# Patient Record
Sex: Female | Born: 1940 | Race: White | Hispanic: No | Marital: Single | Smoking: Never smoker
Health system: Southern US, Community
[De-identification: ages and names within clinical notes are randomized; demographics above are authoritative.]

## PROBLEM LIST (undated history)

## (undated) DIAGNOSIS — G250 Essential tremor: Secondary | ICD-10-CM

## (undated) DIAGNOSIS — K219 Gastro-esophageal reflux disease without esophagitis: Secondary | ICD-10-CM

## (undated) HISTORY — DX: Essential tremor: G25.0

## (undated) HISTORY — PX: APPENDECTOMY: SHX54

## (undated) HISTORY — DX: Gastro-esophageal reflux disease without esophagitis: K21.9

---

## 1978-07-17 HISTORY — PX: TONSILECTOMY/ADENOIDECTOMY WITH MYRINGOTOMY: SHX6125

## 1999-05-18 ENCOUNTER — Encounter: Admission: RE | Admit: 1999-05-18 | Discharge: 1999-05-18 | Payer: Self-pay | Admitting: Emergency Medicine

## 1999-05-18 ENCOUNTER — Encounter: Payer: Self-pay | Admitting: Emergency Medicine

## 1999-11-25 ENCOUNTER — Encounter: Payer: Self-pay | Admitting: Emergency Medicine

## 1999-11-25 ENCOUNTER — Encounter: Admission: RE | Admit: 1999-11-25 | Discharge: 1999-11-25 | Payer: Self-pay | Admitting: Emergency Medicine

## 2000-04-30 ENCOUNTER — Encounter: Payer: Self-pay | Admitting: Emergency Medicine

## 2000-04-30 ENCOUNTER — Encounter: Admission: RE | Admit: 2000-04-30 | Discharge: 2000-04-30 | Payer: Self-pay | Admitting: Emergency Medicine

## 2000-11-26 ENCOUNTER — Encounter: Admission: RE | Admit: 2000-11-26 | Discharge: 2000-11-26 | Payer: Self-pay | Admitting: Emergency Medicine

## 2000-11-26 ENCOUNTER — Encounter: Payer: Self-pay | Admitting: Emergency Medicine

## 2001-01-16 ENCOUNTER — Ambulatory Visit (HOSPITAL_COMMUNITY): Admission: RE | Admit: 2001-01-16 | Discharge: 2001-01-16 | Payer: Self-pay | Admitting: Emergency Medicine

## 2001-03-27 ENCOUNTER — Encounter: Admission: RE | Admit: 2001-03-27 | Discharge: 2001-03-27 | Payer: Self-pay | Admitting: Emergency Medicine

## 2001-03-27 ENCOUNTER — Encounter: Payer: Self-pay | Admitting: Emergency Medicine

## 2001-04-29 ENCOUNTER — Other Ambulatory Visit: Admission: RE | Admit: 2001-04-29 | Discharge: 2001-04-29 | Payer: Self-pay | Admitting: Obstetrics and Gynecology

## 2001-11-27 ENCOUNTER — Encounter: Payer: Self-pay | Admitting: Emergency Medicine

## 2001-11-27 ENCOUNTER — Encounter: Admission: RE | Admit: 2001-11-27 | Discharge: 2001-11-27 | Payer: Self-pay | Admitting: Emergency Medicine

## 2002-04-29 ENCOUNTER — Other Ambulatory Visit: Admission: RE | Admit: 2002-04-29 | Discharge: 2002-04-29 | Payer: Self-pay | Admitting: Gynecology

## 2002-07-25 ENCOUNTER — Encounter: Payer: Self-pay | Admitting: Emergency Medicine

## 2002-07-25 ENCOUNTER — Encounter: Admission: RE | Admit: 2002-07-25 | Discharge: 2002-07-25 | Payer: Self-pay | Admitting: Emergency Medicine

## 2002-08-25 ENCOUNTER — Encounter: Admission: RE | Admit: 2002-08-25 | Discharge: 2002-08-25 | Payer: Self-pay | Admitting: Gynecology

## 2002-08-25 ENCOUNTER — Encounter: Payer: Self-pay | Admitting: Gynecology

## 2002-12-01 ENCOUNTER — Encounter: Payer: Self-pay | Admitting: Emergency Medicine

## 2002-12-01 ENCOUNTER — Encounter: Admission: RE | Admit: 2002-12-01 | Discharge: 2002-12-01 | Payer: Self-pay | Admitting: Emergency Medicine

## 2003-02-12 ENCOUNTER — Encounter: Payer: Self-pay | Admitting: Internal Medicine

## 2003-04-30 ENCOUNTER — Other Ambulatory Visit: Admission: RE | Admit: 2003-04-30 | Discharge: 2003-04-30 | Payer: Self-pay | Admitting: Gynecology

## 2003-05-15 ENCOUNTER — Encounter: Admission: RE | Admit: 2003-05-15 | Discharge: 2003-05-15 | Payer: Self-pay | Admitting: Emergency Medicine

## 2003-12-02 ENCOUNTER — Encounter: Admission: RE | Admit: 2003-12-02 | Discharge: 2003-12-02 | Payer: Self-pay | Admitting: Emergency Medicine

## 2004-05-03 ENCOUNTER — Other Ambulatory Visit: Admission: RE | Admit: 2004-05-03 | Discharge: 2004-05-03 | Payer: Self-pay | Admitting: Gynecology

## 2004-12-05 ENCOUNTER — Encounter: Admission: RE | Admit: 2004-12-05 | Discharge: 2004-12-05 | Payer: Self-pay | Admitting: Emergency Medicine

## 2005-05-08 ENCOUNTER — Encounter: Admission: RE | Admit: 2005-05-08 | Discharge: 2005-05-08 | Payer: Self-pay | Admitting: Emergency Medicine

## 2005-12-19 ENCOUNTER — Encounter: Admission: RE | Admit: 2005-12-19 | Discharge: 2005-12-19 | Payer: Self-pay | Admitting: Emergency Medicine

## 2006-01-02 ENCOUNTER — Encounter: Admission: RE | Admit: 2006-01-02 | Discharge: 2006-01-02 | Payer: Self-pay | Admitting: Emergency Medicine

## 2006-05-24 ENCOUNTER — Other Ambulatory Visit: Admission: RE | Admit: 2006-05-24 | Discharge: 2006-05-24 | Payer: Self-pay | Admitting: Gynecology

## 2006-07-04 ENCOUNTER — Encounter: Admission: RE | Admit: 2006-07-04 | Discharge: 2006-07-04 | Payer: Self-pay | Admitting: Emergency Medicine

## 2006-12-21 ENCOUNTER — Encounter: Admission: RE | Admit: 2006-12-21 | Discharge: 2006-12-21 | Payer: Self-pay | Admitting: Emergency Medicine

## 2007-05-03 ENCOUNTER — Ambulatory Visit: Payer: Self-pay | Admitting: Internal Medicine

## 2007-05-09 ENCOUNTER — Ambulatory Visit (HOSPITAL_COMMUNITY): Admission: RE | Admit: 2007-05-09 | Discharge: 2007-05-09 | Payer: Self-pay | Admitting: Internal Medicine

## 2007-05-21 ENCOUNTER — Encounter: Admission: RE | Admit: 2007-05-21 | Discharge: 2007-05-21 | Payer: Self-pay | Admitting: Emergency Medicine

## 2007-05-24 ENCOUNTER — Ambulatory Visit: Payer: Self-pay | Admitting: Internal Medicine

## 2007-05-24 ENCOUNTER — Encounter: Payer: Self-pay | Admitting: Internal Medicine

## 2007-09-19 DIAGNOSIS — R259 Unspecified abnormal involuntary movements: Secondary | ICD-10-CM | POA: Insufficient documentation

## 2007-09-19 DIAGNOSIS — K2940 Chronic atrophic gastritis without bleeding: Secondary | ICD-10-CM | POA: Insufficient documentation

## 2007-09-19 DIAGNOSIS — T7840XA Allergy, unspecified, initial encounter: Secondary | ICD-10-CM | POA: Insufficient documentation

## 2007-09-19 DIAGNOSIS — K5730 Diverticulosis of large intestine without perforation or abscess without bleeding: Secondary | ICD-10-CM | POA: Insufficient documentation

## 2007-09-19 DIAGNOSIS — F411 Generalized anxiety disorder: Secondary | ICD-10-CM | POA: Insufficient documentation

## 2007-09-19 DIAGNOSIS — K219 Gastro-esophageal reflux disease without esophagitis: Secondary | ICD-10-CM

## 2007-09-19 DIAGNOSIS — D126 Benign neoplasm of colon, unspecified: Secondary | ICD-10-CM

## 2007-09-26 ENCOUNTER — Encounter (INDEPENDENT_AMBULATORY_CARE_PROVIDER_SITE_OTHER): Payer: Self-pay | Admitting: Internal Medicine

## 2007-09-26 ENCOUNTER — Ambulatory Visit: Payer: Self-pay | Admitting: Vascular Surgery

## 2007-09-26 ENCOUNTER — Observation Stay (HOSPITAL_COMMUNITY): Admission: EM | Admit: 2007-09-26 | Discharge: 2007-09-27 | Payer: Self-pay | Admitting: Emergency Medicine

## 2007-12-23 ENCOUNTER — Encounter: Admission: RE | Admit: 2007-12-23 | Discharge: 2007-12-23 | Payer: Self-pay | Admitting: Emergency Medicine

## 2008-03-24 ENCOUNTER — Ambulatory Visit: Payer: Self-pay | Admitting: Internal Medicine

## 2008-04-06 ENCOUNTER — Telehealth: Payer: Self-pay | Admitting: Internal Medicine

## 2008-04-07 ENCOUNTER — Ambulatory Visit: Payer: Self-pay | Admitting: Internal Medicine

## 2008-06-01 ENCOUNTER — Encounter: Admission: RE | Admit: 2008-06-01 | Discharge: 2008-06-01 | Payer: Self-pay | Admitting: Emergency Medicine

## 2008-12-24 ENCOUNTER — Encounter: Admission: RE | Admit: 2008-12-24 | Discharge: 2008-12-24 | Payer: Self-pay | Admitting: Emergency Medicine

## 2009-12-28 ENCOUNTER — Encounter: Admission: RE | Admit: 2009-12-28 | Discharge: 2009-12-28 | Payer: Self-pay | Admitting: Emergency Medicine

## 2010-08-07 ENCOUNTER — Encounter: Payer: Self-pay | Admitting: Emergency Medicine

## 2010-11-28 ENCOUNTER — Other Ambulatory Visit: Payer: Self-pay | Admitting: Emergency Medicine

## 2010-11-28 DIAGNOSIS — Z1231 Encounter for screening mammogram for malignant neoplasm of breast: Secondary | ICD-10-CM

## 2010-11-29 NOTE — H&P (Signed)
[name], [name]                ACCOUNT NO.:  [id number]   MEDICAL RECORD NO.:  [id number]          PATIENT TYPE:  INP   LOCATION:  5501                         FACILITY:  [facility]   PHYSICIAN:  Lonia Blood, M.D.      DATE OF BIRTH:  [birth year]   DATE OF ADMISSION:  09/26/2007  DATE OF DISCHARGE:                              HISTORY & PHYSICAL   PRIMARY CARE PHYSICIAN:  Dr. Leslee Home.   PRESENTING COMPLAINT:  Difficulty speaking, shaky and stress.   HISTORY OF PRESENT ILLNESS:  The patient is a 70 year old female with  history of anxiety disorder and acid reflux who apparently was doing  okay until she had a cold symptoms over 3 days ago.  She is recovering  from the cold, today she started having generalized shaking.  This seems  to localize to her lower extremities and right upper extremity.  Associated with that night was slurring of her speech.  The patient is  unable to speak fluently.  She, however, did not lose any consciousness.  She was fully aware of what was going on.  She denied any focal  weakness.  No paresthesia.  No numbness.  She was brought into to the  emergency room where she has improved somewhat but still is unable to  speak fluently.  She has slowed and slurred.   PAST MEDICAL HISTORY:  Mainly GERD and anxiety disorder.   ALLERGY:  ASPIRIN, BENADRYL, CLARITIN, NEURONTIN AND PENICILLIN.   MEDICATION:  1. Prevacid 30 mg daily.  2. Xanax 0.25 mg q.a.c. p.r.n.   SOCIAL HISTORY:  The patient lives alone.  She is pretty active.  Denied  any tobacco, alcohol or IV drug use.   FAMILY HISTORY:  Noncontributory.   REVIEW OF SYSTEMS:  An 18-point review of systems is negative except per  HPI.   PHYSICAL EXAMINATION:  VITAL SIGNS:  Temperature is 97, blood pressure  167/76, pulse 81, respiratory rate 22, sats 100% on room air.  GENERAL:  The patient is awake, alert, oriented in no acute distress.  HEENT: PERRL.  EOMI.  NECK:  Supple.  No JVD, no  lymphadenopathy.  RESPIRATORY:  She has good air entry bilaterally.  No wheezes or rales.  CARDIOVASCULAR:  Shows S1-S2 no murmurs.  ABDOMEN:  Soft, nontender with positive bowel sounds.  EXTREMITIES: No edema, cyanosis or clubbing.  NEUROLOGICAL:  Cranial nerves II-XII seems to be intact.  Power is 5/5  upper and lower extremities, respectively.  The patient has noted  several pronator drifts on the right however, with some shaking.  She  had some slowed speech, but very audible and comprehendible.   LABORATORY DATA:  Sodium of 130, potassium 4.1, chloride 99, BUN 7,  glucose 118.  White count is 7.5, hemoglobin 12.7, platelet count 402.  Urinalysis showed cloudy urine with small hemoglobin and small leukocyte  esterase, WBCs 7-10, RBC of 3-6 and few bacteria.  Urine drug screen  essentially negative.  Alcohol level less than 5.   ASSESSMENT:  This is a 70 year old female presenting with acute slurred  speech and shaking.  No other focal weakness.  Differentials include  acute cerebrovascular accident.  Could also be some parkinsonian  symptoms, but acute nature makes it difficult to decide on that.  The  patient also was not on any specific medication that could lead to that.   PLAN:  1. Slurred speech and shakiness.  Will treat the patient as she has a      CVA.  Check MRI, MRA of the brain, carotid Dopplers, 2-D echo, B12,      RPR, homocysteine levels.  Head CT was essentially noncommittal.      The patient's dad apparently had a stroke, so she has some elements      of family history.  Will give her some aspirin, do swallowing      evaluation and get speech therapy as well as occupational and      physical therapy to see the patient.  2. Anxiety disorder.  I will continue with Xanax in the hospital as      needed.  3. Gastroesophageal reflux disease.  I will keep the patient on a PPI      while in the hospital.  4. Urinary tract infection.  This may be asymptomatic bacteria  with      mild but with unexplained symptoms.  I will empirically treat her      UTI for 3 days with Cipro.  5. Hyponatremia.  Will hydrate the patient gently and hopefully her      sodium will go back to normal.  Further treatment will depend on      how she does in the hospital.      Lonia Blood, M.D.  Electronically Signed     LG/MEDQ  D:  09/26/2007  T:  09/27/2007  Job:  [id number]

## 2010-11-29 NOTE — Procedures (Signed)
EEG NUMBER:  [id number]   HISTORY:  This is a 70 year old with tremors and anxiety and stuttering  speech.  The patient had an EEG done to evaluate for seizure activity.   PROCEDURE:  This is a routine EEG.   TECHNICAL DESCRIPTION:  Throughout this routine EEG, there is a  posterior-dominant rhythm of 8- to 9-Hz activity at 10-20 microvolts.  The background activity is fairly symmetric, mostly comprised of alpha-  and theta-range activity at 10-20 microvolts.  Intermittently noticed  throughout the background there is sharply contoured slowing over the  bilateral frontotemporal head regions, but seems more prominent over the  left hemisphere than the right.  With photic stimulation, there is a  symmetric photic driving response noted.  Hyperventilation does not  produce any significant abnormalities.  The patient does not go to sleep  during this tracing.  Throughout this record, there is no definitive  epileptiform activity noted.  EKG tracing shows a heart rate of  approximately 60 beats per minute.   IMPRESSION:  This routine EEG is abnormal secondary to intermittent  bilateral left greater than right frontal and temporal intermittent  sharply contoured slowing.  This slowing could be suggestive of an  underlying structural lesion and cortical irritability.  If seizures are  of high concern, would consider repeating the study in the suitable  interval.  Clinical correlation is advised.      Bevelyn Buckles. Nash Shearer, M.D.  Electronically Signed     QVZ:DGLO  D:  09/26/2007 19:36:19  T:  09/28/2007 08:43:00  Job #:  [id number]

## 2010-11-29 NOTE — Assessment & Plan Note (Signed)
[facility]                         GASTROENTEROLOGY OFFICE NOTE   LOREL, LEMBO                       MRN:          [id number]  DATE:05/03/2007                            DOB:          1940/09/12    Ms. Sallie is a very nice 70 year old white female who we saw in the  hospital for a screening colonoscopy in 1996, 1999, and again in July  2004.  She had a hyperplastic polyp of the colon on the last colonoscopy  in 2004.  She is here today at Dr. Melanee Spry recommendation because of  refractory gastroesophageal reflux.  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  The patient  was evaluated by Dr. Gerilyn Pilgrim and found to have a gastroesophageal reflux  disease.  She was initially on Nexium 40 mg a day for several months,  which was then changed to Aciphex 20 mg a day, and finally to Prevacid  30 mg a day and currently 30 mg twice a day for the past 2 years.  There  is some improvement of her reflux with these medications but she still  had, especially in the last 6-8 weeks, two severe episodes of burning  substernally all the way into her mouth and in the nose.  There is no  food regurgitation.  There is no dysphagia or odynophagia.  The attack  occurred during the day and continued into the night.  She denies taking  aspirin or NSAIDs.  She, in the past, could not tolerate Fosamax or any  bone-building medications.   CURRENT MEDICATIONS:  1. Prevacid 30 mg p.o. b.i.d.  2. She discontinued ranitidine 150 mg two at bedtime.  3. Gas-X.  4. Fexofenadine 180 mg daily.  5. Alprazolam 0.25 mg b.i.d.  6. Nystatin vaginal cream.  7. Vagifem.  8. She takes One-A-Day Women's vitamins.  9. Calcium.  10.Betacarotene.  11.Cranberry supplement with vitamin C.  12.Magnesium.  13.Probiotic acidophilus.   PAST HISTORY:  Significant for:  1. Fine tremor.  2. Recent anxiety.  3. Allergies.  4. Gastroesophageal  reflux.   OPERATIONS:  Appendectomy.   FAMILY HISTORY:  Negative for colon cancer.  Diabetes in mother.  Breast  cancer in mother.  Her sister had gallbladder disease and  cholecystectomy.   SOCIAL HISTORY:  She is single.  Lives by herself.  She is a retired  Radio producer.  She does not smoke and does not drink alcohol.   REVIEW OF SYSTEMS:  Positive for stable weight, eye glasses, allergies.   PHYSICAL EXAMINATION:  VITAL SIGNS:  Blood pressure 122/78, pulse 60,  and weight 129 pounds.  GENERAL:  She was somewhat anxious with fine tremor of her hands.  EYES:  Sclerae nonicteric.  ORAL CAVITY:  Normal.  Her voice was normal.  No hoarseness or cough.  NECK:  Supple.  No adenopathy.  LUNGS:  Clear to auscultation.  COR:  Normal S1, normal S2.  ABDOMEN:  Soft with tend in the subxiphoid area in the midline which did  not extend to the left or right upper quadrants.  Lower abdomen was  normal.  Bowel sounds were normoactive.  There was no distention.  RECTAL:  Not done.   IMPRESSION:  39. A 70 year old white female with a persistent intermittent burning      substernally related to her esophagus.  She is already on maximum      dose of proton pump inhibitor.  She has tried a different variety      of proton pump inhibitors.  Rule out bile reflux.  Rule out      esophageal dysmotility with inadequate clearance of the acid with      hiatal hernia.  Rule out  NSAID  gastropathy.  2. Food intolerance.  Rule out functional dyspepsia versus symptomatic      gallbladder disease.  The patient has a family history of      gallbladder disease in a sister.  3. History of adenomatous polyps of the colon.  The last colonoscopy      with hyperplastic polyps on July 2004.  Recall colonoscopy      scheduled for July 2009.   PLAN:  1. Upper endoscopy scheduled.  2. Continue Prevacid 30 mg p.o. b.i.d. for now.  3. Upper abdominal ultrasound.  4. Consider adding Reglan to her regimen.  She  is quite intolerant of      various medications, therefore, I would like to wait for the      results of the endoscopy before introducing a new medication.     Hedwig Morton. Juanda Chance, MD  Electronically Signed    DMB/MedQ  DD: 05/03/2007  DT: 05/05/2007  Job #: [id number]   cc:   Reuben Likes, M.D.  Lucky Cowboy, MD

## 2010-11-29 NOTE — Consult Note (Signed)
Frank, Colleen                ACCOUNT NO.:  [id number]   MEDICAL RECORD NO.:  [id number]          PATIENT TYPE:  INP   LOCATION:  5501                         FACILITY:  [facility]   PHYSICIAN:  Bevelyn Buckles. [place], M.D.DATE OF BIRTH:  12-20-1940   DATE OF CONSULTATION:  09/26/2007  DATE OF DISCHARGE:                                 CONSULTATION   NEUROLOGICAL CONSULTATION   REFERRING PHYSICIAN:  Wilson Singer, M.D.   REASON FOR CONSULTATION:  Tremor and stuttering speech.   HISTORY OF PRESENT ILLNESS:  [name] is a 70 year old Caucasian  female with past medical history of essential tremor and anxiety who  presents with increased tremor and stuttering speech.  The patient says  she was shaking vigorously all over without loss of consciousness.  She  also has increased stuttering of speech.  She had chills at home but no  fever was documented.  She denies any headaches, vision changes,  swallowing problems, chewing problems, weakness, numbness, dizziness,  vertigo or loss of consciousness.   PAST MEDICAL HISTORY:  Positive for essential tremor and anxiety.   CURRENT MEDICATIONS:  1. Cipro.  2. Xanax p.r.n.   ALLERGIES:  ASPIRIN, PENICILLIN, NEURONTIN, LORATADINE, BENADRYL.   FAMILY HISTORY:  Positive for diabetes and stroke.   SOCIAL HISTORY:  The patient denies any smoking, alcohol or drug use.   REVIEW OF SYSTEMS:  Positives as per history of present illness.  Review  of systems negative except for history of present illness in greater  than 7 other organ systems.   PHYSICAL EXAMINATION:  VITAL SIGNS:  Temperature 98.0, pulse is 72,  respirations 20, blood pressure 147/72, oxygen saturation is 97% on room  air.  HEENT:  Normocephalic, atraumatic.  Extraocular movements are intact.  Face is symmetric.  NECK:  Supple.  HEART:  Regular.  LUNGS:  Clear.  ABDOMEN:  Soft.  EXTREMITIES:  Show good pulses.  NEUROLOGICAL:  Patient is awake, alert, does have a  tremendous amount of  anxiety, has stuttering speech.  No aphasia is present.  Cranial nerves  are intact.  Motor exam shows 5/5 strength and no drift noted.  The  patient has a right greater than left postural tremor noted.  Sensory  exam:  Patient has normal tone in all four extremities.  Sensory  examination is within normal limits to light touch examination.  Reflexes are 2 to 3+ throughout toes and neutral bilaterally.  Cerebellar function is within normal limits, finger to nose; gait was  not tested secondary to safety.   LABORATORY DATA:  WBC 7.5, hemoglobin 12.7, platelet count 402,000.  Sodium is 133, potassium is 3.7, chloride 108, CO2 is 26, BUN is 6,  creatinine 0.63,  glucose 103.  Liver function tests are within normal  limits.  Alcohol level is less than 5.  Urine drug screen is negative.  Urinalysis shows 7 to 10 white blood cells.  TSH is 4.563.  RPR is  nonreactive.   CT scan of head showed no acute abnormalities with small vessel disease.  EEG is pending.  2-D echo is pending.  MRI shows artifact versus small  infarct in the right frontal lobe.  This is likely artifact as it is  negative on the ABCs, shows small vessel disease and atrophy.  Carotid  Doppler showed no ICA stenosis.   IMPRESSION AND PLANS:  This is a 70 year old Caucasian female with  increased anxiety, tremors and stuttering speech.  Her tremor is most  consistent with an essential tremor.  We will plan the patient on  maintenance Xanax at 0.5 mg b.i.d.  I have reviewed the MRI with  radiology and I believe this is likely artifact in the left frontal lobe  secondary to negative hypodensity on ABC.  Will place the patient on  Plavix given her small vessel disease seen on her scan.  We recommend  continuing the infection treatment as the infection could have  exacerbated her chronic symptoms.  The patient does seem to have been  improved since she has been in the hospital and we will follow the   patient while she is in the hospital.      Bevelyn Buckles. Nash Shearer, M.D.  Electronically Signed     DRC/MEDQ  D:  09/26/2007  T:  09/27/2007  Job:  [id number]

## 2010-12-30 ENCOUNTER — Ambulatory Visit
Admission: RE | Admit: 2010-12-30 | Discharge: 2010-12-30 | Disposition: A | Discharge status: Home / Self Care | Payer: Medicare Other | Source: Ambulatory Visit | Attending: Emergency Medicine | Admitting: Emergency Medicine

## 2010-12-30 DIAGNOSIS — Z1231 Encounter for screening mammogram for malignant neoplasm of breast: Secondary | ICD-10-CM

## 2011-01-02 ENCOUNTER — Other Ambulatory Visit: Payer: Self-pay | Admitting: Emergency Medicine

## 2011-01-02 DIAGNOSIS — R928 Other abnormal and inconclusive findings on diagnostic imaging of breast: Secondary | ICD-10-CM

## 2011-01-05 ENCOUNTER — Ambulatory Visit
Admission: RE | Admit: 2011-01-05 | Discharge: 2011-01-05 | Disposition: A | Discharge status: Home / Self Care | Payer: Medicare Other | Source: Ambulatory Visit | Attending: Emergency Medicine | Admitting: Emergency Medicine

## 2011-01-05 DIAGNOSIS — R928 Other abnormal and inconclusive findings on diagnostic imaging of breast: Secondary | ICD-10-CM

## 2011-04-10 LAB — CBC
HCT: 36.2
Platelets: 402 — ABNORMAL HIGH
RDW: 13
WBC: 7.5

## 2011-04-10 LAB — I-STAT 8, (EC8 V) (CONVERTED LAB)
Acid-Base Excess: 2
Bicarbonate: 25.2 — ABNORMAL HIGH
Glucose, Bld: 118 — ABNORMAL HIGH
HCT: 39
Hemoglobin: 13.3
Operator id: 270651
Potassium: 4.1
Sodium: 130 — ABNORMAL LOW
TCO2: 26

## 2011-04-10 LAB — CULTURE, BLOOD (ROUTINE X 2)
Culture: NO GROWTH
Culture: NO GROWTH
Culture: NO GROWTH

## 2011-04-10 LAB — ETHANOL: Alcohol, Ethyl (B): <5

## 2011-04-10 LAB — COMPREHENSIVE METABOLIC PANEL
Albumin: 3.9
Alkaline Phosphatase: 62
BUN: 6
CO2: 26
Chloride: 100
Creatinine, Ser: 0.63
GFR calc non Af Amer: >60
Potassium: 3.9
Total Bilirubin: 0.6

## 2011-04-10 LAB — RAPID URINE DRUG SCREEN, HOSP PERFORMED
Benzodiazepines: NOT DETECTED
Cocaine: NOT DETECTED
Tetrahydrocannabinol: NOT DETECTED

## 2011-04-10 LAB — DIFFERENTIAL
Basophils Absolute: 0
Eosinophils Relative: 0
Lymphocytes Relative: 25
Lymphs Abs: 1.9
Neutro Abs: 5.1

## 2011-04-10 LAB — B-NATRIURETIC PEPTIDE (CONVERTED LAB): Pro B Natriuretic peptide (BNP): 45

## 2011-04-10 LAB — CARDIAC PANEL(CRET KIN+CKTOT+MB+TROPI)
CK, MB: 1.2
CK, MB: 1.3
Relative Index: INVALID
Total CK: 68
Troponin I: 0.03

## 2011-04-10 LAB — POCT I-STAT CREATININE
Creatinine, Ser: 0.7
Operator id: 270651

## 2011-04-10 LAB — CK TOTAL AND CKMB (NOT AT ARMC): CK, MB: 0.9

## 2011-04-10 LAB — RPR: RPR Ser Ql: NONREACTIVE

## 2011-04-10 LAB — URINE MICROSCOPIC-ADD ON

## 2011-04-10 LAB — URINALYSIS, ROUTINE W REFLEX MICROSCOPIC
Glucose, UA: NEGATIVE
Nitrite: NEGATIVE
Protein, ur: NEGATIVE
pH: 7.5

## 2011-04-10 LAB — MAGNESIUM: Magnesium: 2.4

## 2011-05-10 ENCOUNTER — Other Ambulatory Visit: Payer: Self-pay | Admitting: Dermatology

## 2011-05-31 ENCOUNTER — Other Ambulatory Visit: Payer: Self-pay | Admitting: Emergency Medicine

## 2011-05-31 DIAGNOSIS — N6489 Other specified disorders of breast: Secondary | ICD-10-CM

## 2011-07-07 ENCOUNTER — Ambulatory Visit
Admission: RE | Admit: 2011-07-07 | Discharge: 2011-07-07 | Disposition: A | Discharge status: Home / Self Care | Payer: Medicare Other | Source: Ambulatory Visit | Attending: Emergency Medicine | Admitting: Emergency Medicine

## 2011-07-07 DIAGNOSIS — N6489 Other specified disorders of breast: Secondary | ICD-10-CM

## 2011-11-27 ENCOUNTER — Other Ambulatory Visit: Payer: Self-pay | Admitting: Family Medicine

## 2011-11-27 DIAGNOSIS — N6489 Other specified disorders of breast: Secondary | ICD-10-CM

## 2011-11-27 DIAGNOSIS — Z1231 Encounter for screening mammogram for malignant neoplasm of breast: Secondary | ICD-10-CM

## 2011-12-22 ENCOUNTER — Other Ambulatory Visit: Payer: Self-pay | Admitting: Family Medicine

## 2011-12-22 DIAGNOSIS — M8580 Other specified disorders of bone density and structure, unspecified site: Secondary | ICD-10-CM

## 2011-12-28 ENCOUNTER — Other Ambulatory Visit: Payer: Medicare Other

## 2012-01-02 ENCOUNTER — Ambulatory Visit
Admission: RE | Admit: 2012-01-02 | Discharge: 2012-01-02 | Disposition: A | Discharge status: Home / Self Care | Payer: Medicare Other | Source: Ambulatory Visit | Attending: Family Medicine | Admitting: Family Medicine

## 2012-01-02 ENCOUNTER — Other Ambulatory Visit: Payer: Self-pay | Admitting: Family Medicine

## 2012-01-02 DIAGNOSIS — N6489 Other specified disorders of breast: Secondary | ICD-10-CM

## 2012-01-02 DIAGNOSIS — Z1231 Encounter for screening mammogram for malignant neoplasm of breast: Secondary | ICD-10-CM

## 2012-01-09 ENCOUNTER — Ambulatory Visit
Admission: RE | Admit: 2012-01-09 | Discharge: 2012-01-09 | Disposition: A | Discharge status: Home / Self Care | Payer: Medicare Other | Source: Ambulatory Visit | Attending: Family Medicine | Admitting: Family Medicine

## 2012-01-09 DIAGNOSIS — M8580 Other specified disorders of bone density and structure, unspecified site: Secondary | ICD-10-CM

## 2012-06-18 ENCOUNTER — Other Ambulatory Visit: Payer: Self-pay | Admitting: Gynecology

## 2012-08-13 ENCOUNTER — Encounter: Payer: Self-pay | Admitting: *Deleted

## 2012-08-13 DIAGNOSIS — H9319 Tinnitus, unspecified ear: Secondary | ICD-10-CM

## 2012-08-13 DIAGNOSIS — R251 Tremor, unspecified: Secondary | ICD-10-CM

## 2012-10-22 ENCOUNTER — Encounter: Payer: Self-pay | Admitting: Neurology

## 2012-10-22 ENCOUNTER — Ambulatory Visit (INDEPENDENT_AMBULATORY_CARE_PROVIDER_SITE_OTHER): Payer: 59 | Admitting: Neurology

## 2012-10-22 VITALS — BP 138/70 | HR 82 | Temp 98.4°F | Ht 60.5 in | Wt 138.0 lb

## 2012-10-22 DIAGNOSIS — R259 Unspecified abnormal involuntary movements: Secondary | ICD-10-CM

## 2012-10-22 DIAGNOSIS — G250 Essential tremor: Secondary | ICD-10-CM

## 2012-10-22 MED ORDER — ALPRAZOLAM 0.25 MG PO TBDP: 0.2500 mg | ORAL_TABLET | Freq: Every evening | ORAL | Status: AC | PRN

## 2012-10-22 NOTE — Assessment & Plan Note (Addendum)
A. she was able to control her essential tremor with the prescribed medications of purpura made nortriptyline and occasionally alprazolam. A problem also treat the insomnia patient still shows no signs of parkinsonism and the diagnosis of a familiar and inherited  of tremor is likely. Revisit in 12 months

## 2012-10-22 NOTE — Patient Instructions (Signed)
Tremor  Tremor is a rhythmic, involuntary muscular contraction characterized by oscillations (to-and-fro movements) of a part of the body. The most common of all involuntary movements, tremor can affect various body parts such as the hands, head, facial structures, vocal cords, trunk, and legs; most tremors, however, occur in the hands. Tremor often accompanies neurological disorders associated with aging. Although the disorder is not life-threatening, it can be responsible for functional disability and social embarrassment.  TREATMENT   There are many types of tremor and several ways in which tremor is classified. The most common classification is by behavioral context or position. There are five categories of tremor within this classification: resting, postural, kinetic, task-specific, and psychogenic. Resting or static tremor occurs when the muscle is at rest, for example when the hands are lying on the lap. This type of tremor is often seen in patients with Parkinson's disease. Postural tremor occurs when a patient attempts to maintain posture, such as holding the hands outstretched. Postural tremors include physiological tremor, essential tremor, tremor with basal ganglia disease (also seen in patients with Parkinson's disease), cerebellar postural tremor, tremor with peripheral neuropathy, post-traumatic tremor, and alcoholic tremor. Kinetic or intention (action) tremor occurs during purposeful movement, for example during finger-to-nose testing. Task-specific tremor appears when performing goal-oriented tasks such as handwriting, speaking, or standing. This group consists of primary writing tremor, vocal tremor, and orthostatic tremor. Psychogenic tremor occurs in both older and younger patients. The key feature of this tremor is that it dramatically lessens or disappears when the patient is distracted.  PROGNOSIS  There are some treatment options available for tremor; the appropriate treatment depends on  accurate diagnosis of the cause. Some tremors respond to treatment of the underlying condition, for example in some cases of psychogenic tremor treating the patient's underlying mental problem may cause the tremor to disappear. Also, patients with tremor due to Parkinson's disease may be treated with Levodopa drug therapy. Symptomatic drug therapy is available for several other tremors as well. For those cases of tremor in which there is no effective drug treatment, physical measures such as teaching the patient to brace the affected limb during the tremor are sometimes useful. Surgical intervention such as thalamotomy or deep brain stimulation may be useful in certain cases.  Document Released: 06/23/2002 Document Revised: 09/25/2011 Document Reviewed: 07/03/2005  [employer] Patient Information 2013 [employer].

## 2012-10-22 NOTE — Progress Notes (Signed)
[facility]  Provider:  Dr Kamali Sakata Referring Provider: Carolyne Fiscal, MD Primary Care Physician:  Carolyne Fiscal, MD  Chief Complaint  Patient presents with  . Follow-up    TREMOR,rm #11    HPI:  [name] is a 72 y.o. female here as a referral from Dr. Duaine Dredge who took her care over from Dr Lorenz Coaster. As were pending by 72 year old Caucasian right handed female is treated for essential tremor. She could not tolerate beta blockers in the past and has done well on a combination of a Russell line and tube are made she also uses nortriptyline. The tremor has not been interfering with her ability to perform active in normal activities. She does have a more noticeable essential tremor over the last years however her a jaw tremor and slight intubation of the multiple signs the patient has no change in her voice. She has not tolerated Primidone  well in the past.  Review of Systems: Out of a complete 14 system review, the patient complains of only the following symptoms, and all other reviewed systems are negative. Trauma, depressed mood, paroxysmal elevated blood pressures, not enough sleep, gastroesophageal reflux.  History   Social History  . Marital Status: Single    Spouse Name: N/A    Number of Children: N/A  . Years of Education: N/A   Occupational History  . Not on file.   Social History Main Topics  . Smoking status: Never Smoker   . Smokeless tobacco: Not on file  . Alcohol Use: No  . Drug Use: Not on file  . Sexually Active: Not on file   Other Topics Concern  . Not on file   Social History Narrative  . No narrative on file    Family History  Problem Relation Age of Onset  . Other Mother   . Dementia Mother   . Tremor Mother   . Stroke Father   . Heart disease Father   . Migraines Other     Past Medical History  Diagnosis Date  . GERD without esophagitis   . Tremor, essential     Past Surgical History  Procedure Laterality  Date  . Appendectomy    . Tonsilectomy/adenoidectomy with myringotomy  1980    Current Outpatient Prescriptions  Medication Sig Dispense Refill  . aspirin 81 MG tablet Take 81 mg by mouth daily.      Marland Kitchen CALCIUM CARBONATE PO Take by mouth 2 (two) times daily. One tablet daily      . Cholecalciferol (VITAMIN D-3 PO) Take by mouth daily.      Marland Kitchen CRANBERRY PO Take by mouth. 4 tablets daily      . escitalopram (LEXAPRO) 10 MG tablet Take 10 mg by mouth daily. One tablet daily      . estradiol (VAGIFEM) 25 MCG vaginal tablet 10 mcg 3 (three) times a week.       . Flaxseed, Linseed, (FLAXSEED OIL PO) Take by mouth. Two tablet daily      . Multiple Vitamin (MULTIVITAMIN WITH MINERALS) TABS Take 1 tablet by mouth daily. One a day vitamin supplement,daily      . nortriptyline (PAMELOR) 10 MG capsule Take 10 mg by mouth 2 (two) times daily. At bedtime      . topiramate (TOPAMAX) 25 MG tablet Take 0.25 mg by mouth 2 (two) times daily.       Marland Kitchen ALPRAZolam (NIRAVAM) 0.25 MG dissolvable tablet Take 0.25 mg by mouth 2 (two) times daily. 2  tablets daily       No current facility-administered medications for this visit.    Allergies as of 10/22/2012 - Review Complete 10/22/2012  Allergen Reaction Noted  . Cephalexin  08/13/2012  . Clonazepam  08/13/2012  . Neurontin (gabapentin)  08/13/2012  . Primidone  08/13/2012  . Propranolol hcl  08/13/2012  . Amitriptyline  08/13/2012  . Aspirin  03/24/2008  . Ciprofloxacin  10/22/2012  . Levaquin (levofloxacin)  10/22/2012  . Paroxetine  10/22/2012  . Penicillins  03/24/2008  . Fruit & vegetable daily (nutritional supplements) Rash 10/22/2012    Vitals: BP 138/70  Pulse 82  Temp(Src) 98.4 F (36.9 C)  Ht 5' 0.5" (1.537 m)  Wt 138 lb (62.596 kg)  BMI 26.5 kg/m2 Last Weight:  Wt Readings from Last 1 Encounters:  10/22/12 138 lb (62.596 kg)   Last Height:   Ht Readings from Last 1 Encounters:  10/22/12 5' 0.5" (1.537 m)   .  Physical  exam:  General: The patient is awake, alert and appears not in acute distress. The patient is well groomed. Head: Normocephalic, atraumatic. Neck is supple. Mallampati 2 , neck circumference:14 . Cardiovascular:  Regular rate and rhythm, without  murmurs or carotid bruit, and without distended neck veins. Respiratory: Lungs are clear to auscultation. Skin:  Without evidence of edema, or rash Trunk: BMI is normal  and patient  has normal posture.   Neurologic exam : The patient is awake and alert, oriented to place and time.  Memory subjective  described as intact. There is a normal attention span & concentration ability. Speech is fluent without  Dysphonia , there is vocal tremor.   Mood and affect are appropriate.  Cranial nerves: Pupils are equal and briskly reactive to light. Funduscopic exam without evidence of pallor or edema- beginning cataracts . Extraocular movements  in vertical and horizontal planes intact and without nystagmus. Visual fields by finger perimetry are intact. Hearing to finger rub intact.  Facial sensation intact to fine touch. Facial motor strength is symmetric and tongue and uvula move midline.  Motor exam: Normal tone and normal muscle bulk and symmetric normal strength in all extremities.No cogwheeling,   Sensory:  Fine touch, pinprick and vibration were tested in all extremities. Proprioception is tested in the upper extremities only. This was  normal.  Coordination: Rapid alternating movements in the fingers/hands is tested and normal. Finger-to-nose maneuver tested and normal without evidence of ataxia, dysmetria or tremor.  Gait and station: Patient walks without assistive device and is able and assisted stool climb up to the exam table. Strength within normal limits. Stance is stable and normal. Tandem gait is unfragmented , Romberg testing is normal.  Deep tendon reflexes: in the  upper and lower extremities are symmetric and intact. Babinski maneuver  response is downgoing.   Assessment:  After physical and neurologic examination, review of laboratory studies, imaging, neurophysiology testing and pre-existing records, assessment will be reviewed on the problem list.  Plan:  Treatment plan and additional workup will be reviewed under Problem List. Refills were recently done by dr blomgren .

## 2012-11-26 ENCOUNTER — Other Ambulatory Visit: Payer: Self-pay

## 2012-11-26 DIAGNOSIS — Z1231 Encounter for screening mammogram for malignant neoplasm of breast: Secondary | ICD-10-CM

## 2013-01-07 ENCOUNTER — Ambulatory Visit
Admission: RE | Admit: 2013-01-07 | Discharge: 2013-01-07 | Disposition: A | Discharge status: Home / Self Care | Payer: Medicare Other | Source: Ambulatory Visit

## 2013-01-07 DIAGNOSIS — Z1231 Encounter for screening mammogram for malignant neoplasm of breast: Secondary | ICD-10-CM

## 2013-06-15 ENCOUNTER — Emergency Department (HOSPITAL_COMMUNITY)
Admission: EM | Admit: 2013-06-15 | Discharge: 2013-06-15 | Disposition: A | Discharge status: Home / Self Care | Payer: PRIVATE HEALTH INSURANCE | Attending: Emergency Medicine | Admitting: Emergency Medicine

## 2013-06-15 ENCOUNTER — Encounter (HOSPITAL_COMMUNITY): Payer: Self-pay | Admitting: Emergency Medicine

## 2013-06-15 DIAGNOSIS — K219 Gastro-esophageal reflux disease without esophagitis: Secondary | ICD-10-CM | POA: Insufficient documentation

## 2013-06-15 DIAGNOSIS — N390 Urinary tract infection, site not specified: Secondary | ICD-10-CM | POA: Insufficient documentation

## 2013-06-15 DIAGNOSIS — Z880 Allergy status to penicillin: Secondary | ICD-10-CM | POA: Insufficient documentation

## 2013-06-15 DIAGNOSIS — Z79899 Other long term (current) drug therapy: Secondary | ICD-10-CM | POA: Insufficient documentation

## 2013-06-15 DIAGNOSIS — Z8669 Personal history of other diseases of the nervous system and sense organs: Secondary | ICD-10-CM | POA: Insufficient documentation

## 2013-06-15 DIAGNOSIS — Z7982 Long term (current) use of aspirin: Secondary | ICD-10-CM | POA: Insufficient documentation

## 2013-06-15 LAB — URINALYSIS, ROUTINE W REFLEX MICROSCOPIC
Glucose, UA: NEGATIVE mg/dL
Nitrite: NEGATIVE
Specific Gravity, Urine: 1.007 (ref 1.005–1.030)
pH: 7.5 (ref 5.0–8.0)

## 2013-06-15 LAB — CBC WITH DIFFERENTIAL/PLATELET
Eosinophils Absolute: 0 10*3/uL (ref 0.0–0.7)
Eosinophils Relative: 0 % (ref 0–5)
Hemoglobin: 12.1 g/dL (ref 12.0–15.0)
Lymphocytes Relative: 16 % (ref 12–46)
Lymphs Abs: 1 10*3/uL (ref 0.7–4.0)
MCH: 31.8 pg (ref 26.0–34.0)
MCV: 88.7 fL (ref 78.0–100.0)
Monocytes Relative: 4 % (ref 3–12)
Neutrophils Relative %: 79 % — ABNORMAL HIGH (ref 43–77)
RBC: 3.8 MIL/uL — ABNORMAL LOW (ref 3.87–5.11)
WBC: 6.2 10*3/uL (ref 4.0–10.5)

## 2013-06-15 LAB — BASIC METABOLIC PANEL
BUN: 11 mg/dL (ref 6–23)
CO2: 24 mEq/L (ref 19–32)
GFR calc non Af Amer: 80 mL/min — ABNORMAL LOW (ref >90)
Glucose, Bld: 112 mg/dL — ABNORMAL HIGH (ref 70–99)
Potassium: 4.1 mEq/L (ref 3.5–5.1)
Sodium: 130 mEq/L — ABNORMAL LOW (ref 135–145)

## 2013-06-15 LAB — URINE MICROSCOPIC-ADD ON

## 2013-06-15 MED ORDER — NITROFURANTOIN MONOHYD MACRO 100 MG PO CAPS: 100.0000 mg | ORAL_CAPSULE | Freq: Two times a day (BID) | ORAL | Status: AC

## 2013-06-15 NOTE — ED Notes (Signed)
Pt reports she woke this am and felt sudden severe sharp R flank pain that has resolved since onset. Pt reports no bowel/bladder changes but she does feel very thirsty.

## 2013-06-15 NOTE — ED Provider Notes (Signed)
CSN: [id number]     Arrival date & time 06/15/13  0905 History   First MD Initiated Contact with Patient 06/15/13 0914     Chief Complaint  Patient presents with  . Back Pain    HPI  [name] is a 72 y.o. female with a PMH of reflux and tremor who presents to the ED for evaluation of back pain.  History was provided by the patient.  Patient states that she woke up around 6:00 this morning and developed sudden onset sharp upper right back pain which lasted approximately 3 hours. She states her pain has completely resolved since and has not returned. She states she has a mild dull ache, which she wouldn't really consider pain currently.  She denies any abdominal pain however had mild nausea this morning with her pain.  No emesis.  She had 2 normal bowel movements this morning. No rectal bleeding or hematochezia. She states that she had a foul smelling odor for the past 5 days. She took 2 pills of her Macrobid left over from a previous urinary tract infection the past 2 days. She denies any fevers, chest pain, difficulty breathing, headache, change in appetite or activity, dysuria, hematuria, vaginal bleeding, vaginal discharge, shortness of breath, leg pain, numbness, tingling, loss of sensation, loss of bowel or bladder function or weakness. She denies any trauma. She states she's had urinary tract infections in the past however today her symptoms are not similar.  No hx of kidney stones.     Past Medical History  Diagnosis Date  . GERD without esophagitis   . Tremor, essential    Past Surgical History  Procedure Laterality Date  . Appendectomy    . Tonsilectomy/adenoidectomy with myringotomy  1980   Family History  Problem Relation Age of Onset  . Other Mother   . Dementia Mother   . Tremor Mother   . Stroke Father   . Heart disease Father   . Migraines Other    History  Substance Use Topics  . Smoking status: Never Smoker   . Smokeless tobacco: Not on file  . Alcohol Use:  No   OB History   Grav Para Term Preterm Abortions TAB SAB Ect Mult Living                 Review of Systems  Constitutional: Negative for fever, chills, diaphoresis, activity change, appetite change and fatigue.  HENT: Negative for congestion, rhinorrhea and sore throat.   Respiratory: Negative for cough, shortness of breath and wheezing.   Cardiovascular: Negative for chest pain and leg swelling.  Gastrointestinal: Positive for nausea. Negative for vomiting, abdominal pain, diarrhea, constipation and rectal pain.  Genitourinary: Negative for dysuria, hematuria, flank pain, decreased urine volume, vaginal bleeding, vaginal discharge, difficulty urinating and vaginal pain.  Musculoskeletal: Positive for back pain. Negative for gait problem and myalgias.  Skin: Negative for color change.  Neurological: Negative for dizziness, weakness, light-headedness and headaches.    Allergies  Cephalexin; Clonazepam; Neurontin; Primidone; Propranolol hcl; Amitriptyline; Aspirin; Ciprofloxacin; Levaquin; Paroxetine; Penicillins; and Fruit & vegetable daily  Home Medications   Current Outpatient Rx  Name  Route  Sig  Dispense  Refill  . ALPRAZolam (NIRAVAM) 0.25 MG dissolvable tablet   Oral   Take 1 tablet (0.25 mg total) by mouth at bedtime as needed for anxiety. 2 tablets daily   30 tablet   1   . aspirin 81 MG tablet   Oral   Take 81 mg by  mouth daily.         Marland Kitchen CALCIUM CARBONATE PO   Oral   Take by mouth 2 (two) times daily. One tablet daily         . Cholecalciferol (VITAMIN D-3 PO)   Oral   Take by mouth daily.         Marland Kitchen CRANBERRY PO   Oral   Take by mouth. 4 tablets daily         . escitalopram (LEXAPRO) 10 MG tablet   Oral   Take 10 mg by mouth daily. One tablet daily         . estradiol (VAGIFEM) 25 MCG vaginal tablet      10 mcg 3 (three) times a week.          . Flaxseed, Linseed, (FLAXSEED OIL PO)   Oral   Take by mouth. Two tablet daily         .  Multiple Vitamin (MULTIVITAMIN WITH MINERALS) TABS   Oral   Take 1 tablet by mouth daily. One a day vitamin supplement,daily         . nortriptyline (PAMELOR) 10 MG capsule   Oral   Take 10 mg by mouth 2 (two) times daily. At bedtime         . topiramate (TOPAMAX) 25 MG tablet   Oral   Take 0.25 mg by mouth 2 (two) times daily.           BP 155/58  Pulse 89  Temp(Src) 97.9 F (36.6 C) (Oral)  Resp 16  Ht 4' 11.25" (1.505 m)  Wt 135 lb (61.236 kg)  BMI 27.04 kg/m2  SpO2 100%  Filed Vitals:   06/15/13 0911 06/15/13 1100 06/15/13 1130 06/15/13 1230  BP: 155/58 116/53 106/45 144/64  Pulse: 89 74 72 69  Temp: 97.9 F (36.6 C)     TempSrc: Oral     Resp: 16   16  Height: 4' 11.25" (1.505 m)     Weight: 135 lb (61.236 kg)     SpO2: 100% 100% 100% 100%     Physical Exam  Nursing note and vitals reviewed. Constitutional: She is oriented to person, place, and time. She appears well-developed and well-nourished. No distress.  HENT:  Head: Normocephalic and atraumatic.  Right Ear: External ear normal.  Left Ear: External ear normal.  Nose: Nose normal.  Mouth/Throat: Oropharynx is clear and moist. No oropharyngeal exudate.  Eyes: Conjunctivae are normal. Pupils are equal, round, and reactive to light. Right eye exhibits no discharge. Left eye exhibits no discharge.  Neck: Normal range of motion. Neck supple.  Cardiovascular: Normal rate, regular rhythm, normal heart sounds and intact distal pulses.  Exam reveals no gallop and no friction rub.   No murmur heard. Dorsalis pedis pulses present bilaterally  Pulmonary/Chest: Effort normal and breath sounds normal. No respiratory distress. She has no wheezes. She has no rales. She exhibits no tenderness.  Abdominal: Soft. Bowel sounds are normal. She exhibits no distension and no mass. There is no tenderness. There is no rebound and no guarding.  Musculoskeletal: Normal range of motion. She exhibits no edema and no  tenderness.  No tenderness to palpation to the thoracic or lumbar spinous and paraspinal muscles.  No CVA tenderness.  Patient able to ambulate without difficulty or ataxia.  Strength 5/5 in the LE bilaterally  Neurological: She is alert and oriented to person, place, and time.  Skin: Skin is warm and dry. She is not  diaphoretic.    ED Course  Procedures (including critical care time) Labs Review Labs Reviewed - No data to display Imaging Review No results found.  EKG Interpretation   None      Results for orders placed during the hospital encounter of 06/15/13  URINALYSIS, ROUTINE W REFLEX MICROSCOPIC      Result Value Range   Color, Urine YELLOW  YELLOW   APPearance CLEAR  CLEAR   Specific Gravity, Urine 1.007  1.005 - 1.030   pH 7.5  5.0 - 8.0   Glucose, UA NEGATIVE  NEGATIVE mg/dL   Hgb urine dipstick NEGATIVE  NEGATIVE   Bilirubin Urine NEGATIVE  NEGATIVE   Ketones, ur NEGATIVE  NEGATIVE mg/dL   Protein, ur NEGATIVE  NEGATIVE mg/dL   Urobilinogen, UA 0.2  0.0 - 1.0 mg/dL   Nitrite NEGATIVE  NEGATIVE   Leukocytes, UA MODERATE (*) NEGATIVE  URINE MICROSCOPIC-ADD ON      Result Value Range   Squamous Epithelial / LPF FEW (*) RARE   WBC, UA 7-10  <3 WBC/hpf   Bacteria, UA FEW (*) RARE  CBC WITH DIFFERENTIAL      Result Value Range   WBC 6.2  4.0 - 10.5 K/uL   RBC 3.80 (*) 3.87 - 5.11 MIL/uL   Hemoglobin 12.1  12.0 - 15.0 g/dL   HCT 16.1 (*) 09.6 - 04.5 %   MCV 88.7  78.0 - 100.0 fL   MCH 31.8  26.0 - 34.0 pg   MCHC 35.9  30.0 - 36.0 g/dL   RDW 40.9  81.1 - 91.4 %   Platelets 292  150 - 400 K/uL   Neutrophils Relative % 79 (*) 43 - 77 %   Neutro Abs 4.9  1.7 - 7.7 K/uL   Lymphocytes Relative 16  12 - 46 %   Lymphs Abs 1.0  0.7 - 4.0 K/uL   Monocytes Relative 4  3 - 12 %   Monocytes Absolute 0.3  0.1 - 1.0 K/uL   Eosinophils Relative 0  0 - 5 %   Eosinophils Absolute 0.0  0.0 - 0.7 K/uL   Basophils Relative 1  0 - 1 %   Basophils Absolute 0.1  0.0 - 0.1 K/uL   BASIC METABOLIC PANEL      Result Value Range   Sodium 130 (*) 135 - 145 mEq/L   Potassium 4.1  3.5 - 5.1 mEq/L   Chloride 97  96 - 112 mEq/L   CO2 24  19 - 32 mEq/L   Glucose, Bld 112 (*) 70 - 99 mg/dL   BUN 11  6 - 23 mg/dL   Creatinine, Ser 7.82  0.50 - 1.10 mg/dL   Calcium 8.9  8.4 - 95.6 mg/dL   GFR calc non Af Amer 80 (*) >90 mL/min   GFR calc Af Amer >90  >90 mL/min    MDM   [name] is a 72 y.o. female with a PMH of reflux and tremor who presents to the ED for evaluation of back pain.  CBC, BMP, and UA ordered to further evaluate.     Rechecks  12:40 PM = Patient asymptomatic.  Has to urinate, but otherwise "fine."  No pain.  Dressed and ready for discharge.     Etiology of back pain possibly due to a UTI.  Patient reports foul odor for the past few days.  She denied any back pain throughout her ED visit.  Her urine was suggestive of a UTI  and was sent for culture.  Patient discharged on Macrobid due to hx of UTI's and multiple allergies.  Patient had a negative bedside US per Dr. Anitra Lauth.  Patient neurovascularly intact.  No hx of trauma.  Labs unremarkable.  Patient remained in no acute distress and was afebrile throughout her ED visit.  Patient in agreement with discharge and plan.  Discharge, follow-up, and return precautions were discussed.       Discharge Medication List as of 06/15/2013 12:45 PM    START taking these medications   Details  nitrofurantoin, macrocrystal-monohydrate, (MACROBID) 100 MG capsule Take 1 capsule (100 mg total) by mouth 2 (two) times daily., Starting 06/15/2013, Last dose on Tue 06/24/13, Print        Final impressions: 1. UTI (urinary tract infection)      Luiz Iron PA-C   This patient was discussed with Dr. Mingo Amber, PA-C 06/15/13 2054

## 2013-06-16 LAB — URINE CULTURE

## 2013-06-16 NOTE — ED Provider Notes (Signed)
Medical screening examination/treatment/procedure(s) were conducted as a shared visit with non-physician practitioner(s) and myself.  I personally evaluated the patient during the encounter.  EKG Interpretation   None       Patient presents with right flank pain that occurred for 3 hours today and resolved. Patient had treated herself earlier this week for possible UTI do to foul-smelling urine with 2 days of Macrobid. Currently patient has no complaints but on exam has some mild tenderness in the right flank. Bedside ultrasound done showing a normal kidney size and bedside ultrasound of the aorta is negative for AAA. Patient's labs are within normal limits except for moderate leukocytes with white blood cells and bacteria in her urine feeling that this is a partially treated UTI. Patient has multiple drug allergies and states the only antibiotic she tolerates his Macrobid. We'll continue with Macrobid for the next 7 days.  EMERGENCY DEPARTMENT [place] ABD/AORTA EXAM Study: Limited Ultrasound of the Abdominal Aorta.  INDICATIONS:Back pain and Age>55 Indication: Multiple views of the abdominal aorta are obtained from the diaphragmatic hiatus to the aortic bifurcation in transverse and sagittal planes with a multi- Frequency probe.  PERFORMED BY: Myself  IMAGES ARCHIVED?: No  FINDINGS: Maximum aortic dimensions are 1.5cm  LIMITATIONS:  none  INTERPRETATION:  No abdominal aortic aneurysm and Abdominal free fluid absent  COMMENT:  Attempted to save images but U/S machine was full and had no further space   Gwyneth Sprout, MD 06/16/13 [phone]

## 2013-06-23 ENCOUNTER — Other Ambulatory Visit: Payer: Self-pay | Admitting: Family Medicine

## 2013-06-23 DIAGNOSIS — R10811 Right upper quadrant abdominal tenderness: Secondary | ICD-10-CM

## 2013-06-25 ENCOUNTER — Ambulatory Visit
Admission: RE | Admit: 2013-06-25 | Discharge: 2013-06-25 | Disposition: A | Discharge status: Home / Self Care | Payer: PRIVATE HEALTH INSURANCE | Source: Ambulatory Visit | Attending: Family Medicine | Admitting: Family Medicine

## 2013-06-25 DIAGNOSIS — R10811 Right upper quadrant abdominal tenderness: Secondary | ICD-10-CM

## 2013-06-25 MED ORDER — IOHEXOL 300 MG/ML  SOLN: 100.0000 mL | Freq: Once | INTRAMUSCULAR | Status: AC | PRN

## 2013-06-26 ENCOUNTER — Other Ambulatory Visit: Payer: Self-pay | Admitting: Family Medicine

## 2013-06-26 ENCOUNTER — Other Ambulatory Visit: Payer: Medicare Other

## 2013-06-26 DIAGNOSIS — R1011 Right upper quadrant pain: Secondary | ICD-10-CM

## 2013-06-30 ENCOUNTER — Ambulatory Visit
Admission: RE | Admit: 2013-06-30 | Discharge: 2013-06-30 | Disposition: A | Discharge status: Home / Self Care | Payer: Medicare Other | Source: Ambulatory Visit | Attending: Family Medicine | Admitting: Family Medicine

## 2013-06-30 DIAGNOSIS — R1011 Right upper quadrant pain: Secondary | ICD-10-CM

## 2013-08-18 ENCOUNTER — Other Ambulatory Visit: Payer: Self-pay | Admitting: Family Medicine

## 2013-08-18 DIAGNOSIS — R109 Unspecified abdominal pain: Secondary | ICD-10-CM

## 2013-08-19 ENCOUNTER — Ambulatory Visit
Admission: RE | Admit: 2013-08-19 | Discharge: 2013-08-19 | Disposition: A | Discharge status: Home / Self Care | Payer: PRIVATE HEALTH INSURANCE | Source: Ambulatory Visit | Attending: Family Medicine | Admitting: Family Medicine

## 2013-08-19 DIAGNOSIS — R109 Unspecified abdominal pain: Secondary | ICD-10-CM

## 2013-12-03 ENCOUNTER — Other Ambulatory Visit: Payer: Self-pay

## 2013-12-03 DIAGNOSIS — Z1231 Encounter for screening mammogram for malignant neoplasm of breast: Secondary | ICD-10-CM

## 2014-01-07 ENCOUNTER — Ambulatory Visit
Admission: RE | Admit: 2014-01-07 | Discharge: 2014-01-07 | Disposition: A | Discharge status: Home / Self Care | Payer: 59 | Source: Ambulatory Visit | Attending: Family Medicine | Admitting: Family Medicine

## 2014-01-07 ENCOUNTER — Other Ambulatory Visit: Payer: Self-pay | Admitting: Family Medicine

## 2014-01-07 DIAGNOSIS — M25562 Pain in left knee: Secondary | ICD-10-CM

## 2014-01-08 ENCOUNTER — Ambulatory Visit
Admission: RE | Admit: 2014-01-08 | Discharge: 2014-01-08 | Disposition: A | Discharge status: Home / Self Care | Payer: 59 | Source: Ambulatory Visit

## 2014-01-08 DIAGNOSIS — Z1231 Encounter for screening mammogram for malignant neoplasm of breast: Secondary | ICD-10-CM

## 2014-02-12 ENCOUNTER — Other Ambulatory Visit: Payer: Self-pay | Admitting: Orthopaedic Surgery

## 2014-02-12 DIAGNOSIS — M25562 Pain in left knee: Secondary | ICD-10-CM

## 2014-02-17 ENCOUNTER — Other Ambulatory Visit: Payer: PRIVATE HEALTH INSURANCE

## 2014-12-05 IMAGING — CR DG KNEE COMPLETE 4+V*L*
4 series · 4 of 4 positions shown · non-contrast
Comparison: None.

CLINICAL DATA: Lateral knee pain.

EXAM:
LEFT KNEE - COMPLETE 4+ VIEW

[view not recorded (1 of 4)]
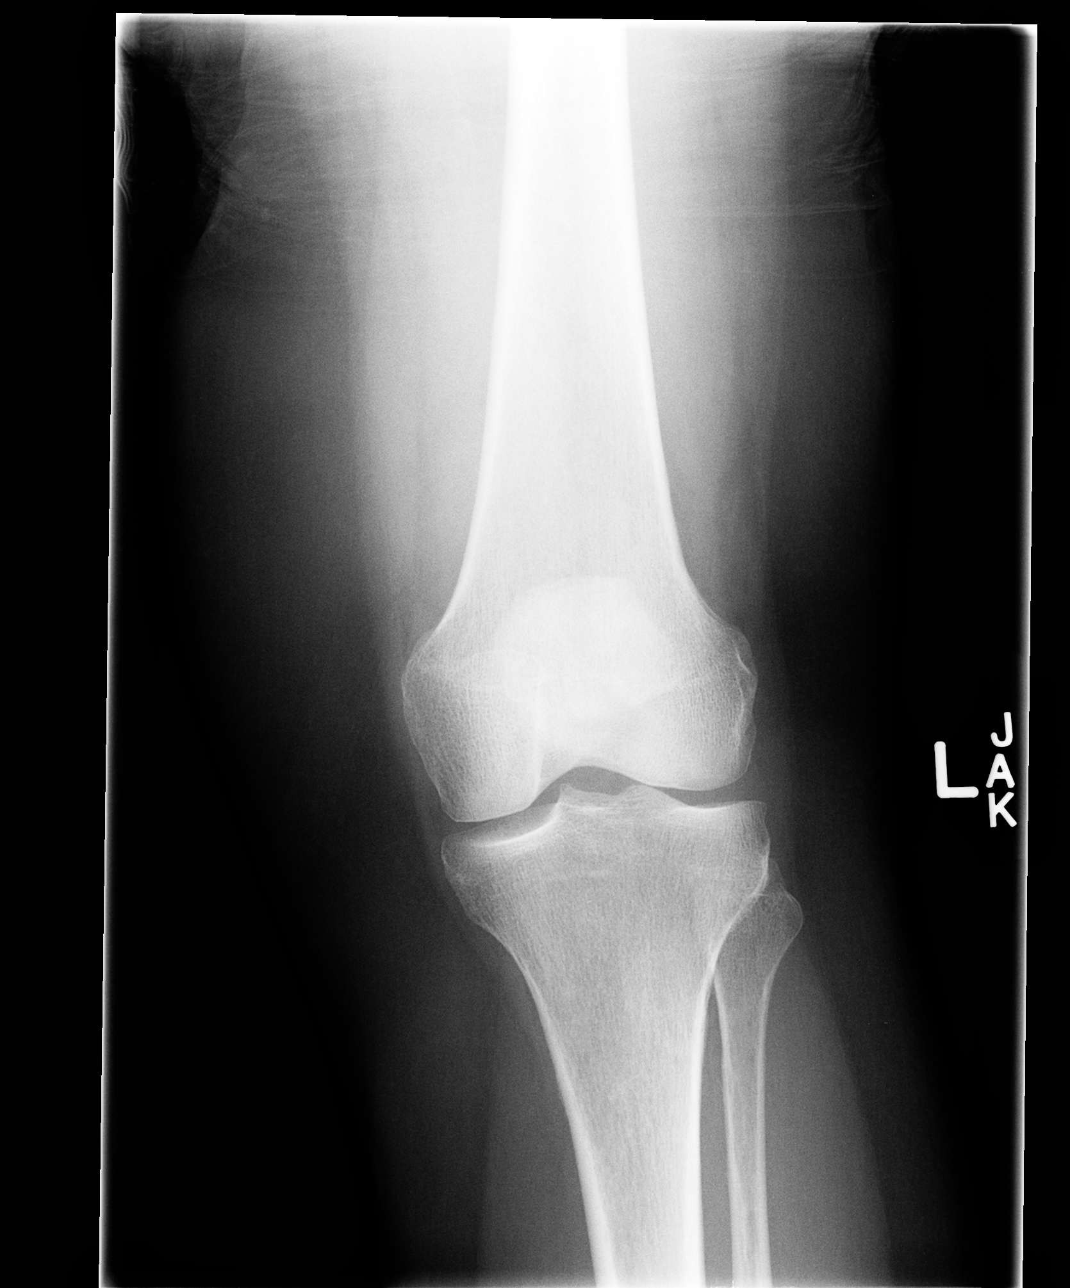

[view not recorded (2 of 4)]
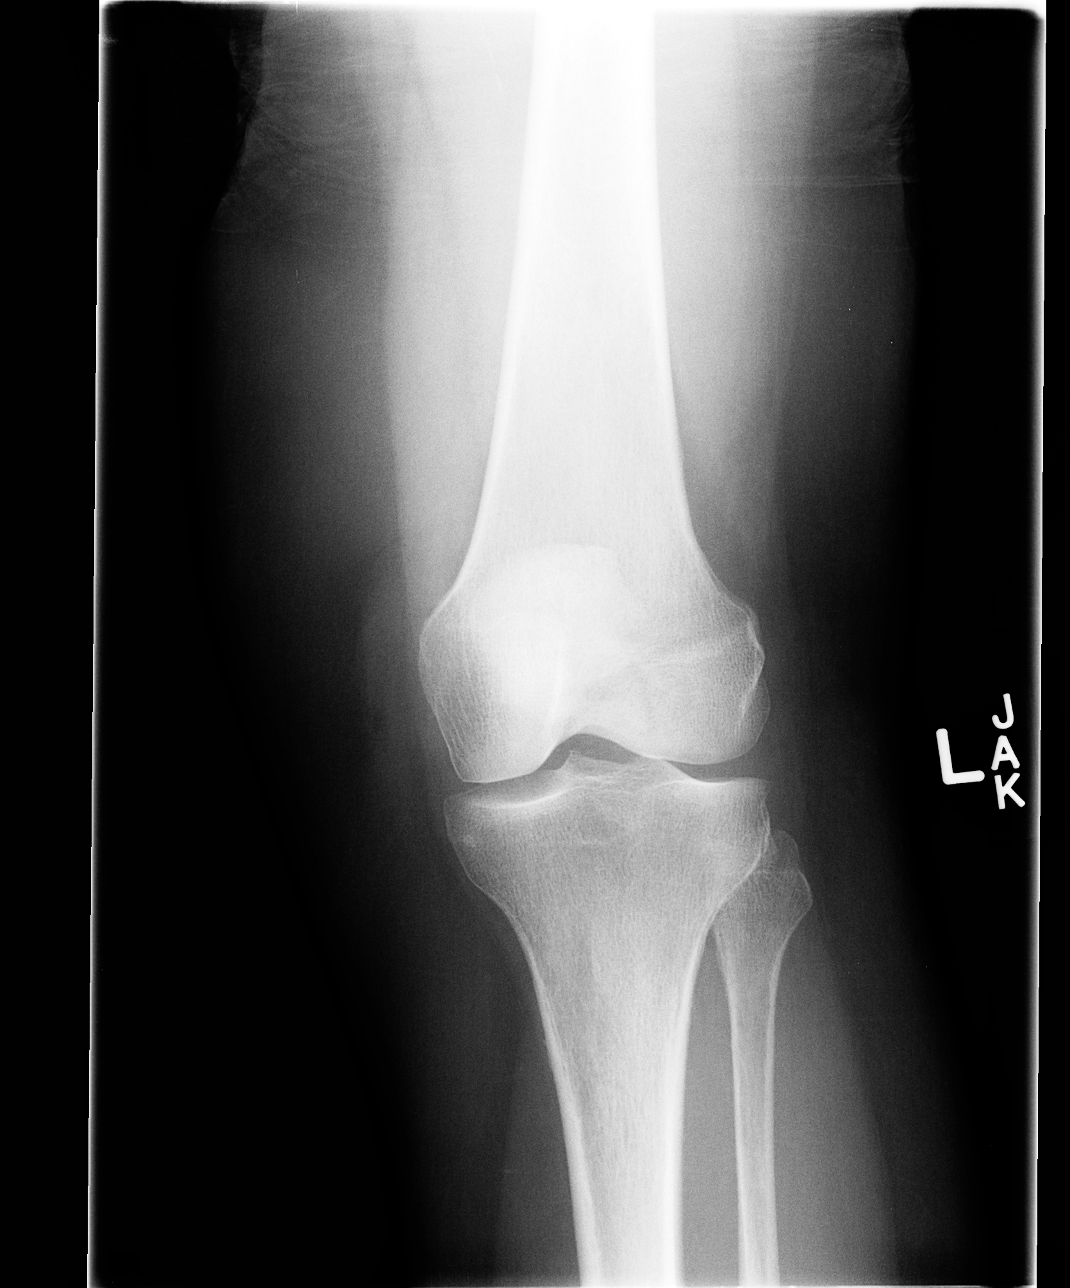

[view not recorded (3 of 4)]
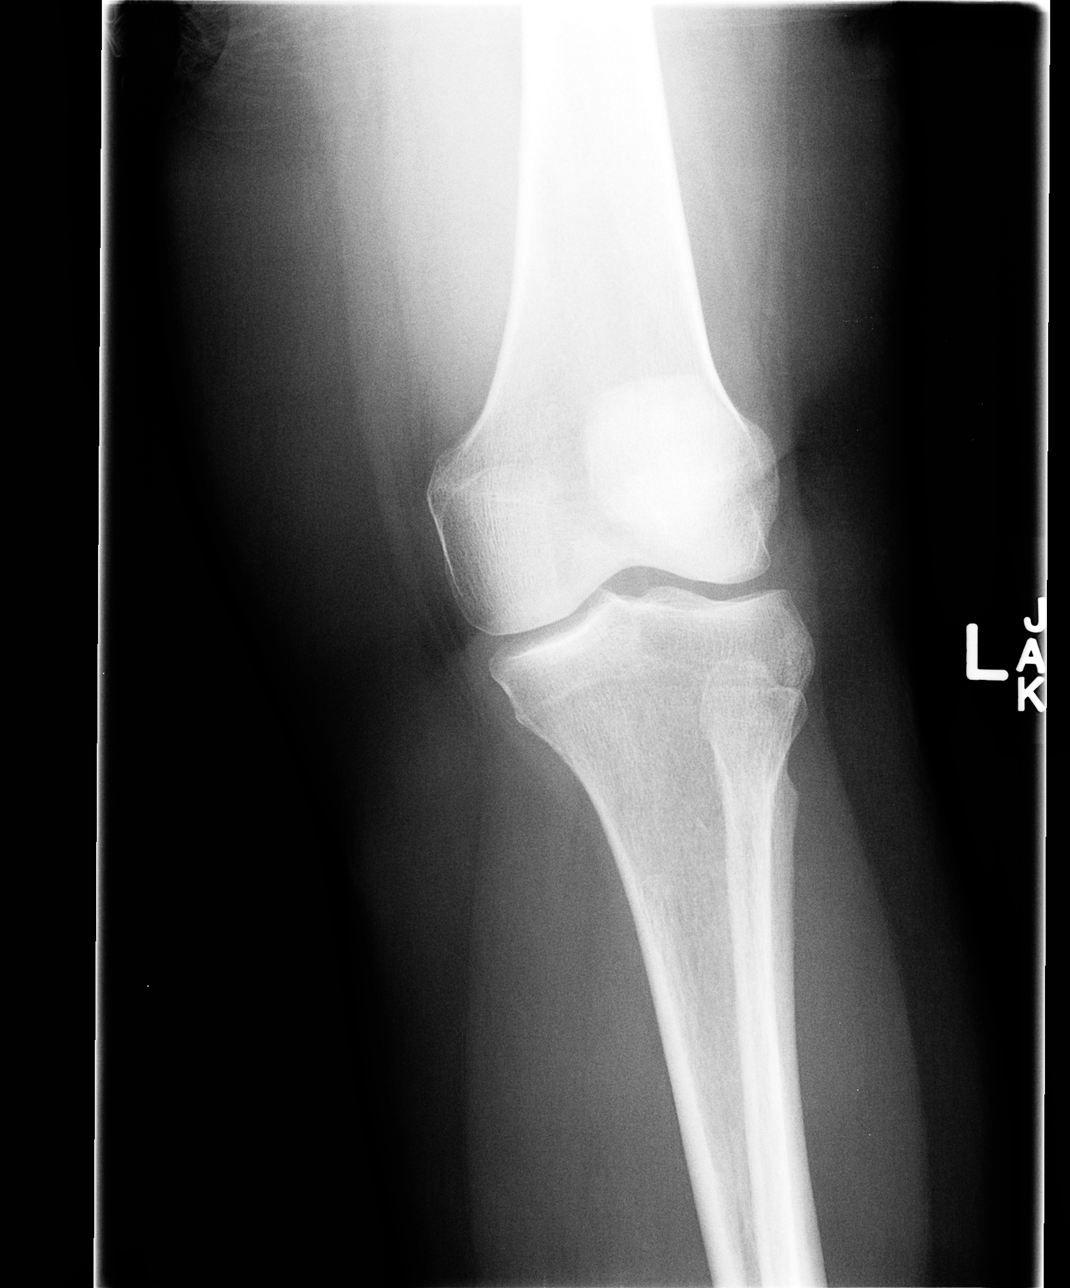

[view not recorded (4 of 4)]
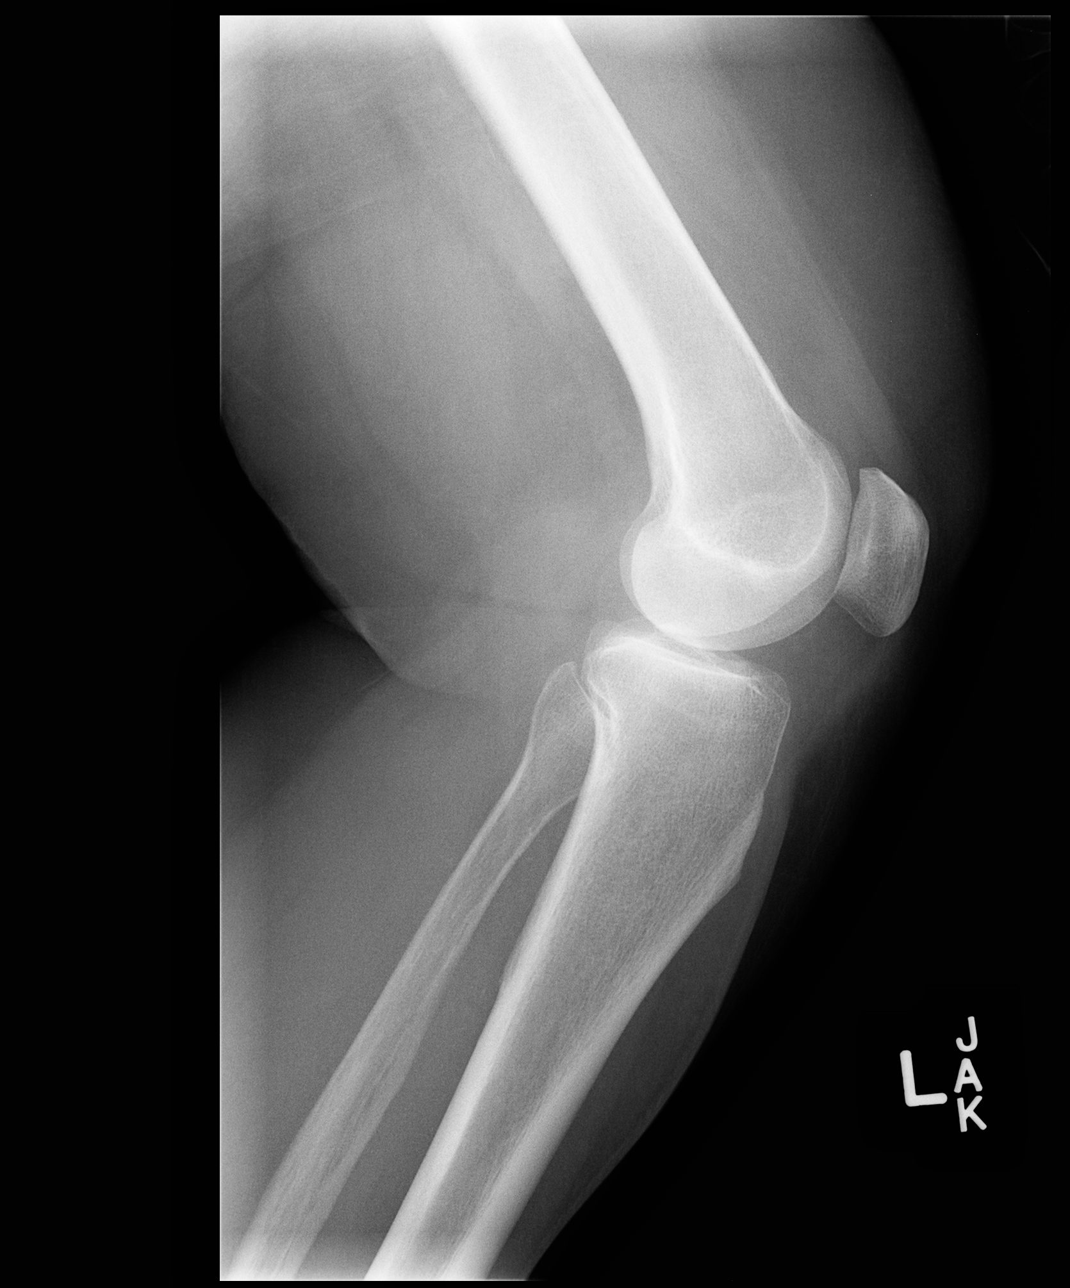

[4 of 4 positions shown; findings below may reference images not displayed]

FINDINGS: The joint spaces are maintained. No acute fracture or osteochondral
abnormality. No joint effusion.
IMPRESSION: No acute bony findings or significant degenerative changes.

## 2014-12-07 ENCOUNTER — Other Ambulatory Visit: Payer: Self-pay

## 2014-12-07 DIAGNOSIS — Z1231 Encounter for screening mammogram for malignant neoplasm of breast: Secondary | ICD-10-CM

## 2014-12-30 ENCOUNTER — Other Ambulatory Visit: Payer: Self-pay | Admitting: Family Medicine

## 2014-12-30 DIAGNOSIS — M8580 Other specified disorders of bone density and structure, unspecified site: Secondary | ICD-10-CM

## 2015-01-11 ENCOUNTER — Ambulatory Visit
Admission: RE | Admit: 2015-01-11 | Discharge: 2015-01-11 | Disposition: A | Discharge status: Home / Self Care | Payer: Medicare Other | Source: Ambulatory Visit

## 2015-01-11 ENCOUNTER — Ambulatory Visit
Admission: RE | Admit: 2015-01-11 | Discharge: 2015-01-11 | Disposition: A | Discharge status: Home / Self Care | Payer: Medicare Other | Source: Ambulatory Visit | Attending: Family Medicine | Admitting: Family Medicine

## 2015-01-11 DIAGNOSIS — M8580 Other specified disorders of bone density and structure, unspecified site: Secondary | ICD-10-CM

## 2015-01-11 DIAGNOSIS — Z1231 Encounter for screening mammogram for malignant neoplasm of breast: Secondary | ICD-10-CM

## 2015-01-12 ENCOUNTER — Other Ambulatory Visit: Payer: Self-pay | Admitting: Family Medicine

## 2015-01-12 DIAGNOSIS — R928 Other abnormal and inconclusive findings on diagnostic imaging of breast: Secondary | ICD-10-CM

## 2015-01-19 ENCOUNTER — Ambulatory Visit
Admission: RE | Admit: 2015-01-19 | Discharge: 2015-01-19 | Disposition: A | Discharge status: Home / Self Care | Payer: Medicare Other | Source: Ambulatory Visit | Attending: Family Medicine | Admitting: Family Medicine

## 2015-01-19 DIAGNOSIS — R928 Other abnormal and inconclusive findings on diagnostic imaging of breast: Secondary | ICD-10-CM
